# Patient Record
Sex: Female | Born: 2005 | Race: White | Hispanic: No | Marital: Single | Smoking: Never smoker
Health system: Southern US, Community
[De-identification: ages and names within clinical notes are randomized; demographics above are authoritative.]

---
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
## 2023-10-20 DIAGNOSIS — M25562 Pain in left knee: Secondary | ICD-10-CM | POA: Diagnosis not present

## 2023-10-20 DIAGNOSIS — M222X2 Patellofemoral disorders, left knee: Secondary | ICD-10-CM | POA: Diagnosis not present

## 2023-10-27 DIAGNOSIS — M25562 Pain in left knee: Secondary | ICD-10-CM | POA: Diagnosis not present

## 2023-10-27 DIAGNOSIS — M222X2 Patellofemoral disorders, left knee: Secondary | ICD-10-CM | POA: Diagnosis not present

## 2023-11-10 DIAGNOSIS — M222X2 Patellofemoral disorders, left knee: Secondary | ICD-10-CM | POA: Diagnosis not present

## 2023-11-16 DIAGNOSIS — M222X2 Patellofemoral disorders, left knee: Secondary | ICD-10-CM | POA: Diagnosis not present

## 2023-11-16 DIAGNOSIS — M25562 Pain in left knee: Secondary | ICD-10-CM | POA: Diagnosis not present

## 2023-12-21 DIAGNOSIS — F411 Generalized anxiety disorder: Secondary | ICD-10-CM | POA: Diagnosis not present

## 2023-12-28 DIAGNOSIS — F411 Generalized anxiety disorder: Secondary | ICD-10-CM | POA: Diagnosis not present

## 2024-01-04 DIAGNOSIS — F411 Generalized anxiety disorder: Secondary | ICD-10-CM | POA: Diagnosis not present

## 2024-01-12 DIAGNOSIS — F411 Generalized anxiety disorder: Secondary | ICD-10-CM | POA: Diagnosis not present

## 2024-01-25 DIAGNOSIS — F411 Generalized anxiety disorder: Secondary | ICD-10-CM | POA: Diagnosis not present

## 2024-02-01 DIAGNOSIS — F411 Generalized anxiety disorder: Secondary | ICD-10-CM | POA: Diagnosis not present

## 2024-02-08 DIAGNOSIS — F411 Generalized anxiety disorder: Secondary | ICD-10-CM | POA: Diagnosis not present

## 2024-02-24 DIAGNOSIS — F411 Generalized anxiety disorder: Secondary | ICD-10-CM | POA: Diagnosis not present

## 2024-02-29 DIAGNOSIS — F411 Generalized anxiety disorder: Secondary | ICD-10-CM | POA: Diagnosis not present

## 2024-03-07 DIAGNOSIS — F411 Generalized anxiety disorder: Secondary | ICD-10-CM | POA: Diagnosis not present

## 2024-03-09 DIAGNOSIS — F411 Generalized anxiety disorder: Secondary | ICD-10-CM | POA: Diagnosis not present

## 2024-03-14 DIAGNOSIS — F411 Generalized anxiety disorder: Secondary | ICD-10-CM | POA: Diagnosis not present

## 2024-03-21 DIAGNOSIS — F411 Generalized anxiety disorder: Secondary | ICD-10-CM | POA: Diagnosis not present

## 2024-03-28 DIAGNOSIS — F411 Generalized anxiety disorder: Secondary | ICD-10-CM | POA: Diagnosis not present

## 2024-04-04 DIAGNOSIS — F411 Generalized anxiety disorder: Secondary | ICD-10-CM | POA: Diagnosis not present

## 2024-04-06 DIAGNOSIS — F411 Generalized anxiety disorder: Secondary | ICD-10-CM | POA: Diagnosis not present

## 2024-04-11 DIAGNOSIS — F411 Generalized anxiety disorder: Secondary | ICD-10-CM | POA: Diagnosis not present

## 2024-04-18 DIAGNOSIS — F411 Generalized anxiety disorder: Secondary | ICD-10-CM | POA: Diagnosis not present

## 2024-04-19 DIAGNOSIS — J069 Acute upper respiratory infection, unspecified: Secondary | ICD-10-CM | POA: Diagnosis not present

## 2024-04-19 DIAGNOSIS — J029 Acute pharyngitis, unspecified: Secondary | ICD-10-CM | POA: Diagnosis not present

## 2024-04-19 DIAGNOSIS — R6883 Chills (without fever): Secondary | ICD-10-CM | POA: Diagnosis not present

## 2024-04-19 DIAGNOSIS — R5383 Other fatigue: Secondary | ICD-10-CM | POA: Diagnosis not present

## 2024-04-19 DIAGNOSIS — Z1152 Encounter for screening for COVID-19: Secondary | ICD-10-CM | POA: Diagnosis not present

## 2024-04-19 DIAGNOSIS — R0981 Nasal congestion: Secondary | ICD-10-CM | POA: Diagnosis not present

## 2024-04-25 DIAGNOSIS — F411 Generalized anxiety disorder: Secondary | ICD-10-CM | POA: Diagnosis not present

## 2024-05-02 DIAGNOSIS — F411 Generalized anxiety disorder: Secondary | ICD-10-CM | POA: Diagnosis not present

## 2024-05-04 DIAGNOSIS — F411 Generalized anxiety disorder: Secondary | ICD-10-CM | POA: Diagnosis not present

## 2024-05-16 DIAGNOSIS — F411 Generalized anxiety disorder: Secondary | ICD-10-CM | POA: Diagnosis not present

## 2024-05-25 DIAGNOSIS — F411 Generalized anxiety disorder: Secondary | ICD-10-CM | POA: Diagnosis not present

## 2024-06-01 DIAGNOSIS — F411 Generalized anxiety disorder: Secondary | ICD-10-CM | POA: Diagnosis not present

## 2024-06-07 DIAGNOSIS — F411 Generalized anxiety disorder: Secondary | ICD-10-CM | POA: Diagnosis not present
